# Patient Record
Sex: Female | Born: 1980
Health system: Southern US, Community
[De-identification: ages and names within clinical notes are randomized; demographics above are authoritative.]

## PROBLEM LIST (undated history)

## (undated) HISTORY — PX: EYE SURGERY: SHX253

---

## 2003-09-20 ENCOUNTER — Emergency Department (HOSPITAL_COMMUNITY): Admission: EM | Admit: 2003-09-20 | Discharge: 2003-09-20 | Payer: Self-pay | Admitting: Family Medicine

## 2003-12-29 ENCOUNTER — Emergency Department (HOSPITAL_COMMUNITY): Admission: EM | Admit: 2003-12-29 | Discharge: 2003-12-29 | Payer: Self-pay | Admitting: Family Medicine

## 2006-10-25 ENCOUNTER — Emergency Department (HOSPITAL_COMMUNITY): Admission: EM | Admit: 2006-10-25 | Discharge: 2006-10-25 | Payer: Self-pay | Admitting: Family Medicine

## 2013-10-12 ENCOUNTER — Other Ambulatory Visit (HOSPITAL_COMMUNITY)
Admission: RE | Admit: 2013-10-12 | Discharge: 2013-10-12 | Disposition: A | Discharge status: Home / Self Care | Payer: BC Managed Care – PPO | Source: Ambulatory Visit | Attending: Family Medicine | Admitting: Family Medicine

## 2013-10-12 ENCOUNTER — Other Ambulatory Visit: Payer: Self-pay | Admitting: Family Medicine

## 2013-10-12 DIAGNOSIS — Z1151 Encounter for screening for human papillomavirus (HPV): Secondary | ICD-10-CM | POA: Insufficient documentation

## 2013-10-12 DIAGNOSIS — Z113 Encounter for screening for infections with a predominantly sexual mode of transmission: Secondary | ICD-10-CM | POA: Insufficient documentation

## 2013-10-12 DIAGNOSIS — Z124 Encounter for screening for malignant neoplasm of cervix: Secondary | ICD-10-CM | POA: Insufficient documentation

## 2017-07-30 DIAGNOSIS — R922 Inconclusive mammogram: Secondary | ICD-10-CM | POA: Diagnosis not present

## 2017-07-31 ENCOUNTER — Other Ambulatory Visit (HOSPITAL_COMMUNITY)
Admission: RE | Admit: 2017-07-31 | Discharge: 2017-07-31 | Disposition: A | Discharge status: Home / Self Care | Payer: BLUE CROSS/BLUE SHIELD | Source: Ambulatory Visit | Attending: Family Medicine | Admitting: Family Medicine

## 2017-07-31 ENCOUNTER — Other Ambulatory Visit: Payer: Self-pay | Admitting: Family Medicine

## 2017-07-31 DIAGNOSIS — Z124 Encounter for screening for malignant neoplasm of cervix: Secondary | ICD-10-CM | POA: Diagnosis not present

## 2017-07-31 DIAGNOSIS — Z202 Contact with and (suspected) exposure to infections with a predominantly sexual mode of transmission: Secondary | ICD-10-CM | POA: Diagnosis not present

## 2017-07-31 DIAGNOSIS — Z0000 Encounter for general adult medical examination without abnormal findings: Secondary | ICD-10-CM | POA: Diagnosis not present

## 2017-07-31 DIAGNOSIS — E559 Vitamin D deficiency, unspecified: Secondary | ICD-10-CM | POA: Diagnosis not present

## 2017-08-04 ENCOUNTER — Other Ambulatory Visit: Payer: Self-pay | Admitting: Family Medicine

## 2017-08-04 DIAGNOSIS — R922 Inconclusive mammogram: Secondary | ICD-10-CM

## 2017-08-04 LAB — CYTOLOGY - PAP
Adequacy: ABSENT
CHLAMYDIA, DNA PROBE: NEGATIVE
DIAGNOSIS: NEGATIVE
HPV (WINDOPATH): NOT DETECTED
NEISSERIA GONORRHEA: NEGATIVE

## 2017-08-24 ENCOUNTER — Other Ambulatory Visit: Payer: Self-pay | Admitting: Family Medicine

## 2017-08-24 ENCOUNTER — Ambulatory Visit
Admission: RE | Admit: 2017-08-24 | Discharge: 2017-08-24 | Disposition: A | Discharge status: Home / Self Care | Payer: BLUE CROSS/BLUE SHIELD | Source: Ambulatory Visit | Attending: Family Medicine | Admitting: Family Medicine

## 2017-08-24 DIAGNOSIS — N6001 Solitary cyst of right breast: Secondary | ICD-10-CM | POA: Diagnosis not present

## 2017-08-24 DIAGNOSIS — R922 Inconclusive mammogram: Secondary | ICD-10-CM

## 2017-08-24 DIAGNOSIS — N63 Unspecified lump in unspecified breast: Secondary | ICD-10-CM

## 2018-03-02 ENCOUNTER — Other Ambulatory Visit: Payer: BLUE CROSS/BLUE SHIELD

## 2018-03-02 ENCOUNTER — Inpatient Hospital Stay: Admission: RE | Admit: 2018-03-02 | Payer: BLUE CROSS/BLUE SHIELD | Source: Ambulatory Visit

## 2018-03-02 ENCOUNTER — Inpatient Hospital Stay
Admission: RE | Admit: 2018-03-02 | Discharge: 2018-03-02 | Disposition: A | Discharge status: Home / Self Care | Payer: BLUE CROSS/BLUE SHIELD | Source: Ambulatory Visit | Attending: Family Medicine | Admitting: Family Medicine

## 2018-03-05 ENCOUNTER — Ambulatory Visit
Admission: RE | Admit: 2018-03-05 | Discharge: 2018-03-05 | Disposition: A | Discharge status: Home / Self Care | Payer: BLUE CROSS/BLUE SHIELD | Source: Ambulatory Visit | Attending: Family Medicine | Admitting: Family Medicine

## 2018-03-05 ENCOUNTER — Other Ambulatory Visit: Payer: Self-pay | Admitting: Family Medicine

## 2018-03-05 DIAGNOSIS — N63 Unspecified lump in unspecified breast: Secondary | ICD-10-CM

## 2018-03-05 DIAGNOSIS — R922 Inconclusive mammogram: Secondary | ICD-10-CM | POA: Diagnosis not present

## 2018-03-05 DIAGNOSIS — N6310 Unspecified lump in the right breast, unspecified quadrant: Secondary | ICD-10-CM | POA: Diagnosis not present

## 2018-05-19 DIAGNOSIS — F4322 Adjustment disorder with anxiety: Secondary | ICD-10-CM | POA: Diagnosis not present

## 2018-05-26 DIAGNOSIS — F4322 Adjustment disorder with anxiety: Secondary | ICD-10-CM | POA: Diagnosis not present

## 2018-06-16 DIAGNOSIS — F4320 Adjustment disorder, unspecified: Secondary | ICD-10-CM | POA: Diagnosis not present

## 2018-06-23 DIAGNOSIS — F4322 Adjustment disorder with anxiety: Secondary | ICD-10-CM | POA: Diagnosis not present

## 2018-06-30 DIAGNOSIS — F4320 Adjustment disorder, unspecified: Secondary | ICD-10-CM | POA: Diagnosis not present

## 2019-05-03 DIAGNOSIS — Z0000 Encounter for general adult medical examination without abnormal findings: Secondary | ICD-10-CM | POA: Diagnosis not present

## 2019-05-11 DIAGNOSIS — E559 Vitamin D deficiency, unspecified: Secondary | ICD-10-CM | POA: Diagnosis not present

## 2019-05-11 DIAGNOSIS — Z13220 Encounter for screening for lipoid disorders: Secondary | ICD-10-CM | POA: Diagnosis not present

## 2019-05-11 DIAGNOSIS — Z0000 Encounter for general adult medical examination without abnormal findings: Secondary | ICD-10-CM | POA: Diagnosis not present

## 2021-07-24 ENCOUNTER — Other Ambulatory Visit: Payer: Self-pay | Admitting: Internal Medicine

## 2021-07-24 DIAGNOSIS — Z1231 Encounter for screening mammogram for malignant neoplasm of breast: Secondary | ICD-10-CM

## 2021-08-13 ENCOUNTER — Ambulatory Visit: Payer: BLUE CROSS/BLUE SHIELD

## 2021-08-13 ENCOUNTER — Ambulatory Visit
Admission: RE | Admit: 2021-08-13 | Discharge: 2021-08-13 | Disposition: A | Discharge status: Home / Self Care | Payer: 59 | Source: Ambulatory Visit | Attending: Internal Medicine | Admitting: Internal Medicine

## 2021-08-13 DIAGNOSIS — Z1231 Encounter for screening mammogram for malignant neoplasm of breast: Secondary | ICD-10-CM

## 2021-08-28 ENCOUNTER — Ambulatory Visit: Payer: BLUE CROSS/BLUE SHIELD | Admitting: Registered"

## 2021-08-29 ENCOUNTER — Encounter: Payer: 59 | Attending: Internal Medicine | Admitting: Skilled Nursing Facility1

## 2021-08-29 ENCOUNTER — Encounter: Payer: Self-pay | Admitting: Skilled Nursing Facility1

## 2021-08-29 ENCOUNTER — Other Ambulatory Visit: Payer: Self-pay

## 2021-08-29 DIAGNOSIS — Z6839 Body mass index (BMI) 39.0-39.9, adult: Secondary | ICD-10-CM | POA: Insufficient documentation

## 2021-08-29 DIAGNOSIS — E6609 Other obesity due to excess calories: Secondary | ICD-10-CM | POA: Insufficient documentation

## 2021-08-29 DIAGNOSIS — Z713 Dietary counseling and surveillance: Secondary | ICD-10-CM | POA: Insufficient documentation

## 2021-08-29 DIAGNOSIS — E669 Obesity, unspecified: Secondary | ICD-10-CM

## 2021-08-29 NOTE — Progress Notes (Signed)
Medical Nutrition Therapy  Appointment Start time:  7:27  Appointment End time:  8:37  Primary concerns today: to lose weight  Referral diagnosis: Obesity Preferred learning style: auditory, visual Learning readiness: not ready, contemplating   NUTRITION ASSESSMENT    Clinical Medical Hx: N/A Medications: zyrtec, flonase Labs: vitamin D 39.9, RBC 4.06 Notable Signs/Symptoms: some joint pain, not as regular with her bowel movements maybe every 2 days, some burping  Lifestyle & Dietary Hx  Pt sates she wants to be healthier but loves food. Pt states her 41 year old is struggling with excess weight.  Pt states has has reduced her pork consumption. Pt states states when she menstruates she craves more carbs and chocolate.  Pt states she does carry stress and tension in her neck and shoulders.  Pt states she does talk therapy 2 times a month.  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 Pt states she has been activly working on not eating out.   Pt states her focus is to be well not necessarily weight.    Body Composition Scale 08/29/2021  Current Body Weight 221.6  Total Body Fat % 43.2  Visceral Fat 14  Fat-Free Mass % 56.7   Total Body Water % 42.8  Muscle-Mass lbs 30.4  BMI 40.4  Body Fat Displacement          Torso  lbs 59.3         Left Leg  lbs 11.8         Right Leg  lbs 11.8         Left Arm  lbs 5.9         Right Arm   lbs 5.9     Estimated daily fluid intake: 50 oz Supplements: multivitamin, vitamin D, vitamin C, vitamin b12 Sleep: some neck pain Stress / self-care: moderate with worry  Current average weekly physical activity: phone app to start this week aiming for 3 days a week  24-Hr Dietary Recall First Meal: starbucks green juice or egg and avacado toast + egg Snack:  Second Meal:  Snack: skinny pop Third Meal: penne pasta + alfredo + chicken + mushrooms + peppers + spinach + tomatoes Snack:  Beverages: 16  ounces coffee + splenda + flavored creamer, water + flavoring, sometimes gatorade zero  Estimated Energy Needs Calories: 1600  NUTRITION INTERVENTION  Nutrition education (E-1) on the following topics:  The importance of consistency with a healthy diet Creating a balanced meal Macronutrient distribution   Handouts Provided Include  Meal ideas   Learning Style & Readiness for Change Teaching method utilized: Visual & Auditory  Demonstrated degree of understanding via: Teach Back  Barriers to learning/adherence to lifestyle change: none identified   Goals Established by Pt Eat 3 meals a day 7 days a week Aim for 1 50 ounce bottle plus another 16 ounces of water Create balanced meals every 3-5 hours Workout with your fiton app 3 days a week: try stretching/yoga to release muscle tension    MONITORING & EVALUATION Dietary intake, weekly physical activity  Next Steps  Patient is to follow up in 4 weeks.

## 2021-09-19 ENCOUNTER — Ambulatory Visit: Payer: 59 | Admitting: Skilled Nursing Facility1

## 2021-09-26 ENCOUNTER — Other Ambulatory Visit: Payer: Self-pay

## 2021-09-26 ENCOUNTER — Encounter: Payer: 59 | Attending: Internal Medicine | Admitting: Skilled Nursing Facility1

## 2021-09-26 DIAGNOSIS — E669 Obesity, unspecified: Secondary | ICD-10-CM | POA: Insufficient documentation

## 2021-09-26 NOTE — Progress Notes (Signed)
Medical Nutrition Therapy  ? ?Primary concerns today: to lose weight  ?Referral diagnosis: Obesity ?Preferred learning style: auditory, visual ?Learning readiness: not ready, contemplating ? ? ?NUTRITION ASSESSMENT  ? ? ?Clinical ?Medical Hx: N/A ?Medications: zyrtec, flonase ?Labs: vitamin D 39.9, RBC 4.06 ?Notable Signs/Symptoms: some joint pain, not as regular with her bowel movements maybe every 2 days, some burping ? ?Lifestyle & Dietary Hx ? ?(Previous) Pt sates she wants to be healthier but loves food. Pt states her 42 year old is struggling with excess weight.  ?Pt states has has reduced her pork consumption. Pt states states when she menstruates she craves more carbs and chocolate.  ?Pt states she does carry stress and tension in her neck and shoulders.  ?Pt states she does talk therapy 2 times a month.  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 Pt states she has been activly working on not eating out.  ? ?Pt states her focus is to be well not necessarily weight.  ? ?Pt states he is very excited to be making changes and taking care of herself ?Pt states she has been very conscious of not skipping meals and drinking more water aiming for 60 ounces and tried steel cut oats which is taking some getting used too because it is chewier.  ?Pt states she is trying to encourage her son to make healthier changes as well recognizing she cannot force it but has been trying with him.  ?Pt states she is thinking through her food choices even when driving with [employer].  ?Pt states she also cut back on flavoring in her water as well.  ? ? ?Body Composition Scale 08/29/2021 09/26/2021  ?Current Body Weight 221.6 218.8  ?Total Body Fat % 43.2 42.9  ?Visceral Fat 14 13  ?Fat-Free Mass % 56.7 57  ? Total Body Water % 42.8 43  ?Muscle-Mass lbs 30.4 30.4  ?BMI 40.4 39.8  ?Body Fat Displacement    ?       Torso  lbs 59.3 58.1  ?       Left Leg  lbs 11.8 11.6  ?       Right Leg  lbs 11.8 11.6   ?       Left Arm  lbs 5.9 5.8  ?       Right Arm   lbs 5.9 5.8  ? ? ? ?Estimated daily fluid intake: 50 oz ?Supplements: multivitamin, vitamin D, vitamin C, vitamin b12 ?Sleep: some neck pain ?Stress / self-care: moderate with worry  ?Current average weekly physical activity: phone app to start this week aiming for 3 days a week ? ?24-Hr Dietary Recall ?First Meal: starbucks green juice or egg and avacado toast + egg or 2 egg and 2 toast whole wheat ?Snack:  ?Second Meal: hibachi ?Snack: [place] peanut butter biscuit  ?Third Meal: lemon pepper chicken + salad ?Snack:  ?Beverages: 16 ounces coffee + splenda + flavored creamer, water + flavoring, sometimes gatorade zero, plain water ? ?Estimated Energy Needs ?Calories: 1600 ? ?NUTRITION INTERVENTION  ?Nutrition education (E-1) on the following topics:  ?The importance of consistency with a healthy diet ?Creating a balanced meal ?Macronutrient distribution  ? ?Handouts Previously Provided Include  ?Meal ideas  ? ?Learning Style & Readiness for Change ?Teaching method utilized: Visual & Auditory  ?Demonstrated degree of understanding via: Teach Back  ?Barriers to learning/adherence to lifestyle change: none identified  ? ?Goals Established by Pt ?Continue:  Eat 3 meals a day 7 days a week ?Continue: Aim for 1 50 ounce bottle plus another 16 ounces of water ?Continue: Create balanced meals every 3-5 hours ?Continue/NEW: Workout with your fiton app 3 days a week: try stretching/yoga to release muscle tension  ? ? ?MONITORING & EVALUATION ?Dietary intake, weekly physical activity ? ?Next Steps  ?Patient is to follow up in 4 weeks. ?

## 2021-10-30 ENCOUNTER — Encounter: Payer: 59 | Attending: Internal Medicine | Admitting: Skilled Nursing Facility1

## 2021-10-30 DIAGNOSIS — Z6839 Body mass index (BMI) 39.0-39.9, adult: Secondary | ICD-10-CM | POA: Insufficient documentation

## 2021-10-30 DIAGNOSIS — E669 Obesity, unspecified: Secondary | ICD-10-CM

## 2021-10-30 NOTE — Progress Notes (Signed)
Medical Nutrition Therapy  ? ?Primary concerns today: to lose weight  ?Referral diagnosis: Obesity ?Preferred learning style: auditory, visual ?Learning readiness: not ready, contemplating ? ? ?NUTRITION ASSESSMENT  ? ? ?Clinical ?Medical Hx: N/A ?Medications: zyrtec, flonase ?Labs: vitamin D 39.9, RBC 4.06 ?Notable Signs/Symptoms: some joint pain, not as regular with her bowel movements maybe every 2 days, some burping ? ?Lifestyle & Dietary Hx ? ? ?Pt states her focus is to be well not necessarily weight.  ? ?Pt states this past month has been really rough stating it has messed with her food focus. Pt states she had a small breakthrough yesterday and took a moment. Pt states she did not include more exercise like she wanted stating she has been focusing on self care instead of worrying about everybody else.  ?Pt states she is proud of herself to keeping her goals in mind and trying to do right. ?Pts stated goals she has reached: Not finishing her whole plate, including vegetables, not just overeating becase of the enviromont.  ? ? ?Body Composition Scale 08/29/2021 09/26/2021 10/30/2021  ?Current Body Weight 221.6 218.8 218.5  ?Total Body Fat % 43.2 42.9 42.9  ?Visceral Fat 14 13 13   ?Fat-Free Mass % 56.7 57 57  ? Total Body Water % 42.8 43 43  ?Muscle-Mass lbs 30.4 30.4 30.4  ?BMI 40.4 39.8 39.8  ?Body Fat Displacement     ?       Torso  lbs 59.3 58.1 58  ?       Left Leg  lbs 11.8 11.6 11.6  ?       Right Leg  lbs 11.8 11.6 11.6  ?       Left Arm  lbs 5.9 5.8 5.8  ?       Right Arm   lbs 5.9 5.8 5.8  ? ? ? ?Estimated daily fluid intake: 50 oz ?Supplements: multivitamin, vitamin D, vitamin C, vitamin b12 ?Sleep: some neck pain ?Stress / self-care: moderate with worry  ?Current average weekly physical activity: ADL's ? ?24-Hr Dietary Recall ?First Meal: starbucks green juice or egg and avacado toast + egg or 2 egg and 2 toast whole wheat ?Snack:  ?Second Meal: hibachi ?Snack: [place] peanut butter biscuit   ?Third Meal: lemon pepper chicken + salad or pasta and zucchiniand salmon ?Snack:  ?Beverages: 16 ounces coffee + splenda + flavored creamer, water + flavoring, sometimes gatorade zero, plain water ? ?Estimated Energy Needs ?Calories: 1600 ? ?NUTRITION INTERVENTION  ?Nutrition education (E-1) on the following topics:  ?The importance of consistency with a healthy diet ?Creating a balanced meal ?Macronutrient distribution  ?Letting diet mentality go ? ?Handouts Previously Provided Include  ?Meal ideas  ? ?Learning Style & Readiness for Change ?Teaching method utilized: Visual & Auditory  ?Demonstrated degree of understanding via: Teach Back  ?Barriers to learning/adherence to lifestyle change: none identified  ? ?Goals Established by Pt ?Continue: Eat 3 meals a day 7 days a week ?Continue: Aim for 1 50 ounce bottle plus another 16 ounces of water ?Continue: Create balanced meals every 3-5 hours ?Continue/NEW: Workout with your fiton app 3 days a week: try stretching/yoga to release muscle tension  ? ? ?MONITORING & EVALUATION ?Dietary intake, weekly physical activity ? ?Next Steps  ?Patient is to follow up: pt states she needs to check with her insurance company to ensure these visits are covered  ?

## 2021-12-04 ENCOUNTER — Ambulatory Visit: Payer: 59 | Admitting: Skilled Nursing Facility1

## 2022-07-05 ENCOUNTER — Ambulatory Visit
Admission: EM | Admit: 2022-07-05 | Discharge: 2022-07-05 | Disposition: A | Discharge status: Home / Self Care | Payer: 59 | Attending: Physician Assistant | Admitting: Physician Assistant

## 2022-07-05 DIAGNOSIS — S46812A Strain of other muscles, fascia and tendons at shoulder and upper arm level, left arm, initial encounter: Secondary | ICD-10-CM | POA: Diagnosis not present

## 2022-07-05 DIAGNOSIS — M25512 Pain in left shoulder: Secondary | ICD-10-CM | POA: Diagnosis not present

## 2022-07-05 MED ORDER — PREDNISONE 20 MG PO TABS: 40.0000 mg | ORAL_TABLET | Freq: Every day | ORAL | 0 refills | Status: AC

## 2022-07-05 MED ORDER — CYCLOBENZAPRINE HCL 10 MG PO TABS: 10.0000 mg | ORAL_TABLET | Freq: Two times a day (BID) | ORAL | 0 refills | Status: AC | PRN

## 2022-07-05 NOTE — ED Provider Notes (Signed)
[facility]    CSN: [id number] Arrival date & time: 07/05/22  1251      History   Chief Complaint Chief Complaint  Patient presents with   Shoulder Pain    HPI Kristin Chase is a 41 y.o. female.   Patient here today for evaluation of left shoulder pain that radiates into her left arm at times.  She states that symptoms started after she recently had slept on her couch a few nights.  She is not sure if she slept incorrectly and that is what caused the pain.  She denies any other known injury.  She does note that throughout life she has noticed more pain in her left shoulder than her right and certain workouts seem to cause issues with her left.  She reports some cold sensation and a shooting type pain at times when she palpates her anterior shoulder.  She also notes a stiffness that is radiating from her neck into her posterior left shoulder.  Movement seems to worsen pain.  She has taken over-the-counter medication without resolution.  The history is provided by the patient.  Shoulder Pain Associated symptoms: no fever     History reviewed. No pertinent past medical history.  There are no problems to display for this patient.   Past Surgical History:  Procedure Laterality Date   EYE SURGERY      OB History   No obstetric history on file.      Home Medications    Prior to Admission medications   Medication Sig Start Date End Date Taking? Authorizing Provider  cyclobenzaprine (FLEXERIL) 10 MG tablet Take 1 tablet (10 mg total) by mouth 2 (two) times daily as needed for muscle spasms. 07/05/22  Yes Tomi Bamberger, PA-C  predniSONE (DELTASONE) 20 MG tablet Take 2 tablets (40 mg total) by mouth daily with breakfast for 5 days. 07/05/22 07/10/22 Yes Tomi Bamberger, PA-C    Family History Family History  Family history unknown: Yes    Social History Social History   Tobacco Use   Smoking status: Unknown     Allergies   Patient has no known  allergies.   Review of Systems Review of Systems  Constitutional:  Negative for chills and fever.  Eyes:  Negative for discharge and redness.  Gastrointestinal:  Negative for nausea and vomiting.  Musculoskeletal:  Positive for arthralgias and myalgias.  Neurological:  Negative for numbness.     Physical Exam Triage Vital Signs ED Triage Vitals  Enc Vitals Group     BP 07/05/22 1439 137/89     Pulse Rate 07/05/22 1439 86     Resp 07/05/22 1439 18     Temp 07/05/22 1439 98 F (36.7 C)     Temp Source 07/05/22 1439 Oral     SpO2 07/05/22 1439 100 %     Weight --      Height --      Head Circumference --      Peak Flow --      Pain Score 07/05/22 1438 6     Pain Loc --      Pain Edu? --      Excl. in GC? --    No data found.  Updated Vital Signs BP 137/89 (BP Location: Right Arm)   Pulse 86   Temp 98 F (36.7 C) (Oral)   Resp 18   LMP 06/07/2022   SpO2 100%      Physical Exam Vitals and nursing note  reviewed.  Constitutional:      General: She is not in acute distress.    Appearance: Normal appearance. She is not ill-appearing.  HENT:     Head: Normocephalic and atraumatic.  Eyes:     Conjunctiva/sclera: Conjunctivae normal.  Cardiovascular:     Rate and Rhythm: Normal rate.  Pulmonary:     Effort: Pulmonary effort is normal. No respiratory distress.  Musculoskeletal:     Comments: No TTP to thoracic or lumbar spine, Mild TTP to left trapezius, Normal ROM of left shoulder  Neurological:     Mental Status: She is alert.  Psychiatric:        Mood and Affect: Mood normal.        Behavior: Behavior normal.        Thought Content: Thought content normal.      UC Treatments / Results  Labs (all labs ordered are listed, but only abnormal results are displayed) Labs Reviewed - No data to display  EKG   Radiology No results found.  Procedures Procedures (including critical care time)  Medications Ordered in UC Medications - No data to  display  Initial Impression / Assessment and Plan / UC Course  I have reviewed the triage vital signs and the nursing notes.  Pertinent labs & imaging results that were available during my care of the patient were reviewed by me and considered in my medical decision making (see chart for details).    Will trial steroid burst and muscle relaxer for suspect muscle strain with possible inflammation affective nerve given shooting pain down arm. Recommend follow up if no gradual improvement or sooner with any worsening.   Final Clinical Impressions(s) / UC Diagnoses   Final diagnoses:  Acute pain of left shoulder  Trapezius muscle strain, left, initial encounter   Discharge Instructions   None    ED Prescriptions     Medication Sig Dispense Auth. Provider   predniSONE (DELTASONE) 20 MG tablet Take 2 tablets (40 mg total) by mouth daily with breakfast for 5 days. 10 tablet Erma Pinto F, PA-C   cyclobenzaprine (FLEXERIL) 10 MG tablet Take 1 tablet (10 mg total) by mouth 2 (two) times daily as needed for muscle spasms. 20 tablet Tomi Bamberger, PA-C      PDMP not reviewed this encounter.   Tomi Bamberger, PA-C 07/05/22 1519

## 2022-07-05 NOTE — ED Triage Notes (Signed)
Pt left shoulder pain X 1 week that she believes may be associated with sleeping pattern .

## 2022-08-18 DIAGNOSIS — Z0000 Encounter for general adult medical examination without abnormal findings: Secondary | ICD-10-CM | POA: Diagnosis not present

## 2022-08-18 DIAGNOSIS — Z124 Encounter for screening for malignant neoplasm of cervix: Secondary | ICD-10-CM | POA: Diagnosis not present

## 2022-08-18 DIAGNOSIS — Z131 Encounter for screening for diabetes mellitus: Secondary | ICD-10-CM | POA: Diagnosis not present

## 2022-08-18 DIAGNOSIS — E559 Vitamin D deficiency, unspecified: Secondary | ICD-10-CM | POA: Diagnosis not present

## 2022-12-03 DIAGNOSIS — R079 Chest pain, unspecified: Secondary | ICD-10-CM | POA: Diagnosis not present

## 2022-12-03 DIAGNOSIS — S99921A Unspecified injury of right foot, initial encounter: Secondary | ICD-10-CM | POA: Diagnosis not present

## 2023-02-04 DIAGNOSIS — Z03818 Encounter for observation for suspected exposure to other biological agents ruled out: Secondary | ICD-10-CM | POA: Diagnosis not present

## 2023-02-04 DIAGNOSIS — J029 Acute pharyngitis, unspecified: Secondary | ICD-10-CM | POA: Diagnosis not present

## 2023-02-09 ENCOUNTER — Other Ambulatory Visit: Payer: Self-pay | Admitting: Internal Medicine

## 2023-02-09 DIAGNOSIS — N63 Unspecified lump in unspecified breast: Secondary | ICD-10-CM

## 2023-02-25 ENCOUNTER — Ambulatory Visit
Admission: RE | Admit: 2023-02-25 | Discharge: 2023-02-25 | Disposition: A | Discharge status: Home / Self Care | Payer: 59 | Source: Ambulatory Visit | Attending: Internal Medicine | Admitting: Internal Medicine

## 2023-02-25 ENCOUNTER — Other Ambulatory Visit: Payer: Self-pay | Admitting: Internal Medicine

## 2023-02-25 DIAGNOSIS — N6011 Diffuse cystic mastopathy of right breast: Secondary | ICD-10-CM | POA: Diagnosis not present

## 2023-02-25 DIAGNOSIS — N6311 Unspecified lump in the right breast, upper outer quadrant: Secondary | ICD-10-CM | POA: Diagnosis not present

## 2023-02-25 DIAGNOSIS — N63 Unspecified lump in unspecified breast: Secondary | ICD-10-CM

## 2023-08-19 DIAGNOSIS — L309 Dermatitis, unspecified: Secondary | ICD-10-CM | POA: Diagnosis not present

## 2023-08-19 DIAGNOSIS — Z13220 Encounter for screening for lipoid disorders: Secondary | ICD-10-CM | POA: Diagnosis not present

## 2023-08-19 DIAGNOSIS — L304 Erythema intertrigo: Secondary | ICD-10-CM | POA: Diagnosis not present

## 2023-08-19 DIAGNOSIS — Z0000 Encounter for general adult medical examination without abnormal findings: Secondary | ICD-10-CM | POA: Diagnosis not present

## 2023-08-19 DIAGNOSIS — E559 Vitamin D deficiency, unspecified: Secondary | ICD-10-CM | POA: Diagnosis not present

## 2023-08-19 DIAGNOSIS — E6609 Other obesity due to excess calories: Secondary | ICD-10-CM | POA: Diagnosis not present

## 2023-08-19 DIAGNOSIS — Z23 Encounter for immunization: Secondary | ICD-10-CM | POA: Diagnosis not present

## 2023-08-19 DIAGNOSIS — Z6839 Body mass index (BMI) 39.0-39.9, adult: Secondary | ICD-10-CM | POA: Diagnosis not present

## 2023-08-19 DIAGNOSIS — M79674 Pain in right toe(s): Secondary | ICD-10-CM | POA: Diagnosis not present

## 2023-09-06 IMAGING — MG MM DIGITAL SCREENING BILAT W/ TOMO AND CAD
8 series · 8 of 24 positions shown · non-contrast
Comparison: Previous exam(s).

CLINICAL DATA: Screening.

EXAM:
DIGITAL SCREENING BILATERAL MAMMOGRAM WITH TOMOSYNTHESIS AND CAD
TECHNIQUE: Bilateral screening digital craniocaudal and mediolateral oblique
mammograms were obtained. Bilateral screening digital breast
tomosynthesis was performed. The images were evaluated with
computer-aided detection.

[R CC synth-2D]
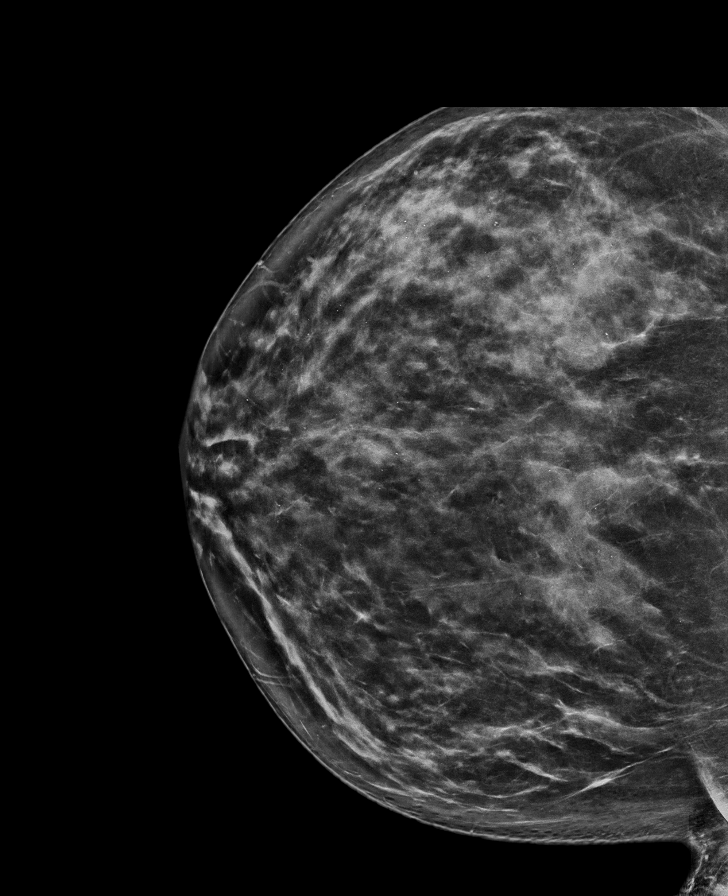

[L CC synth-2D]
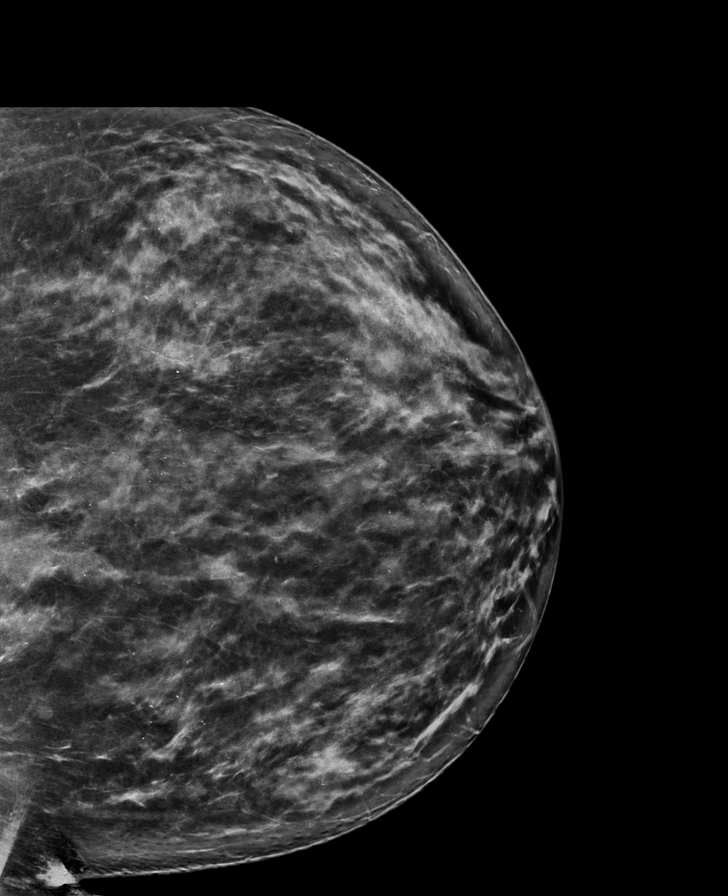

[L MLO synth-2D]
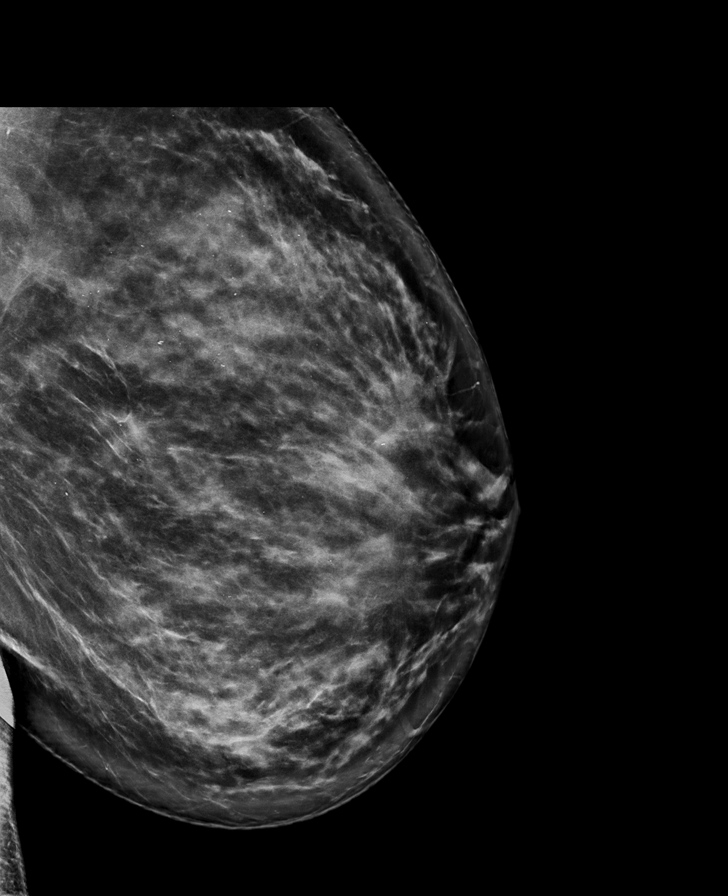

[R MLO synth-2D]
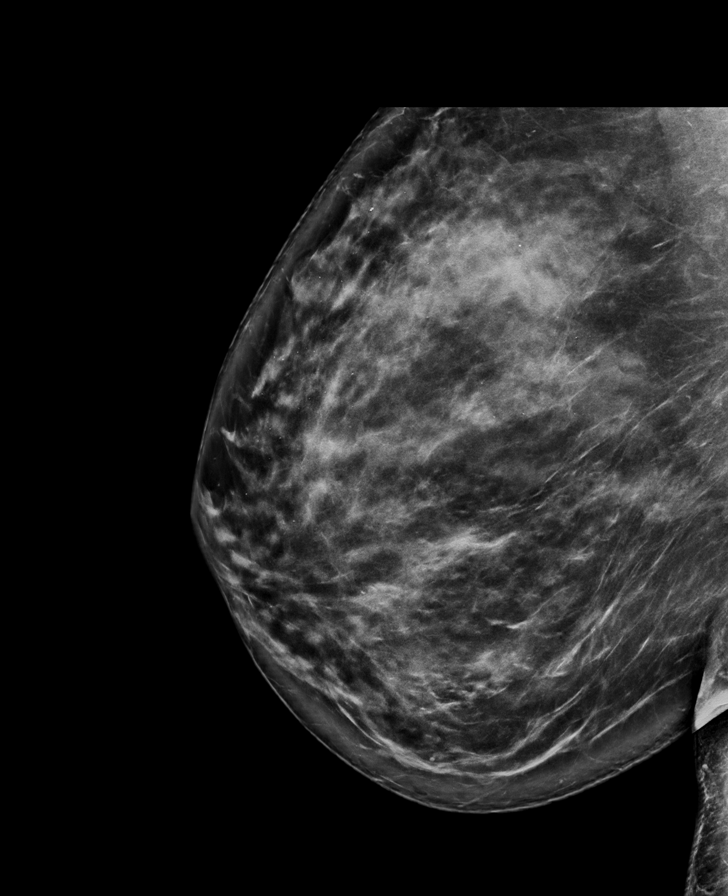

[R CC tomo · tomo slice 47/92.0]
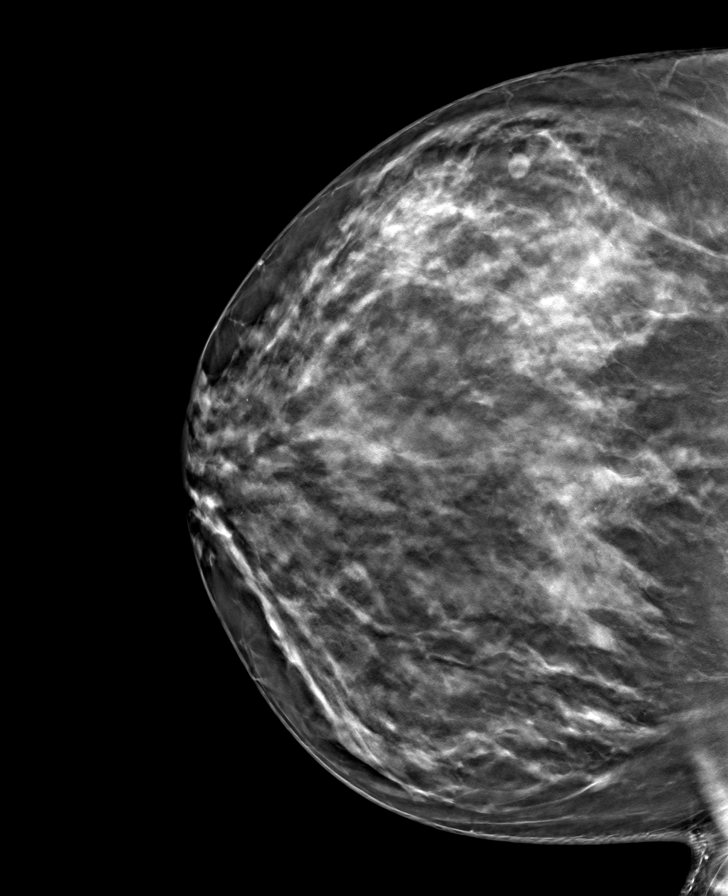

[L CC tomo · tomo slice 47/92.0]
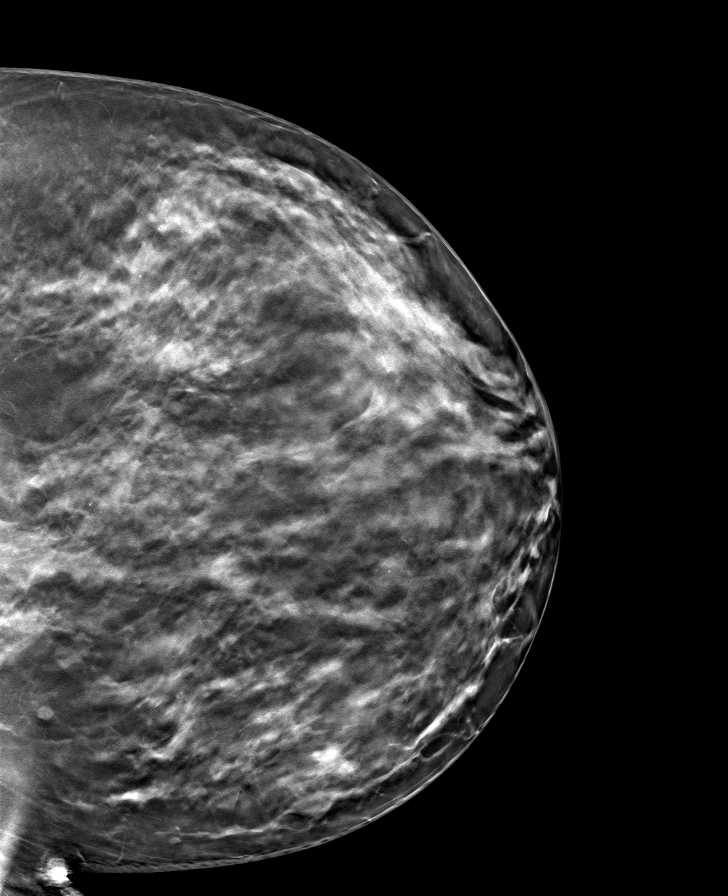

[R MLO tomo · tomo slice 53/105.0]
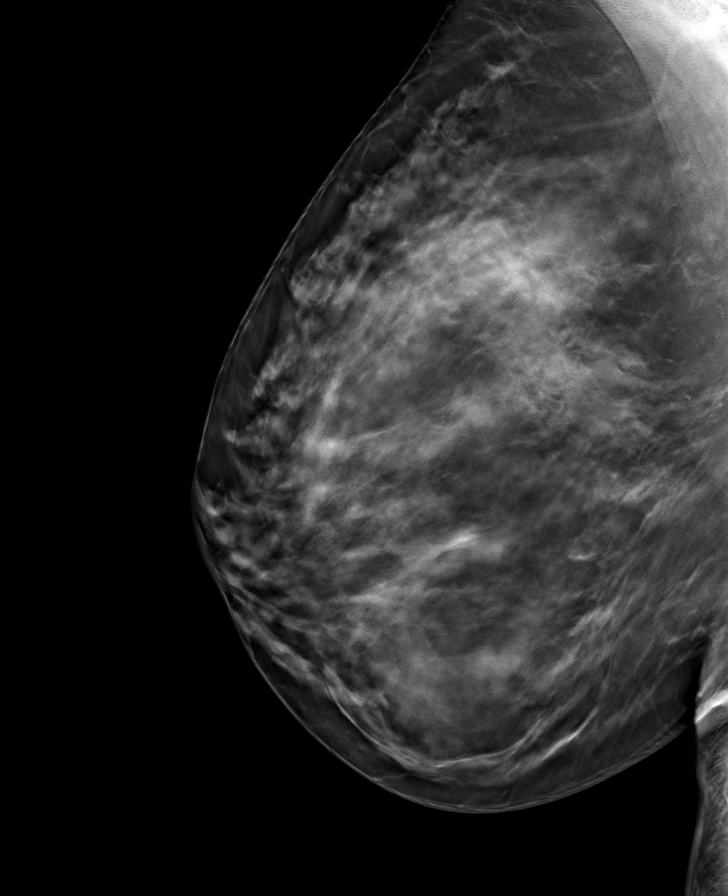

[L MLO tomo · tomo slice 52/103.0]
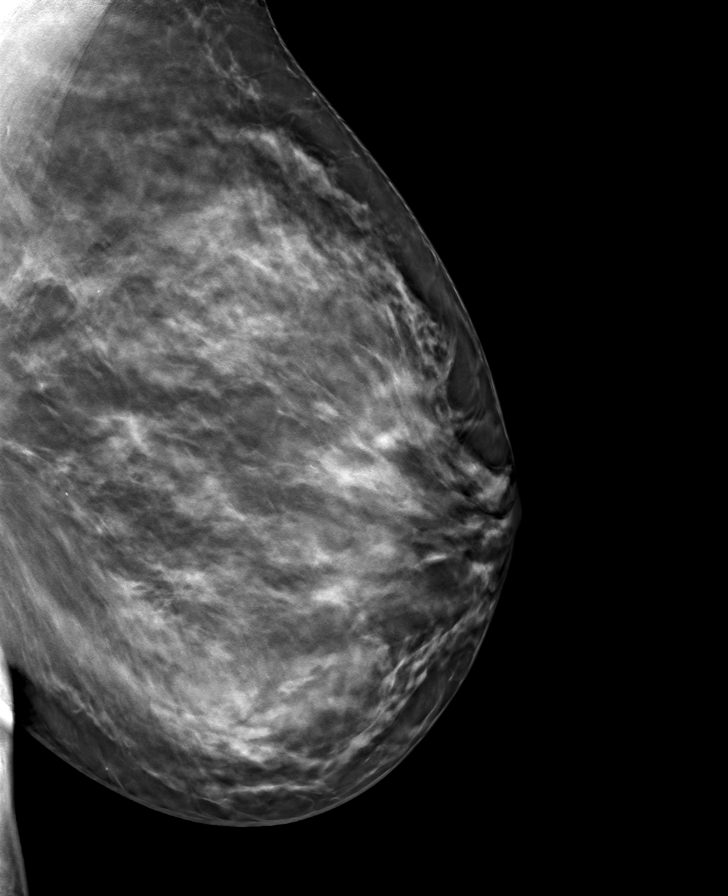

[8 of 24 positions shown; findings below may reference images not displayed]

ACR Breast Density Category c: The breast tissue is heterogeneously
dense, which may obscure small masses.
FINDINGS: There are no findings suspicious for malignancy.
IMPRESSION: No mammographic evidence of malignancy. A result letter of this
screening mammogram will be mailed directly to the patient.

RECOMMENDATION:
Screening mammogram in one year. (Code:Q3-W-BC3)

BI-RADS CATEGORY  1: Negative.

## 2023-09-07 ENCOUNTER — Ambulatory Visit (INDEPENDENT_AMBULATORY_CARE_PROVIDER_SITE_OTHER): Payer: 59

## 2023-09-07 ENCOUNTER — Encounter: Payer: Self-pay | Admitting: Podiatry

## 2023-09-07 ENCOUNTER — Ambulatory Visit: Payer: 59 | Admitting: Podiatry

## 2023-09-07 DIAGNOSIS — S99921D Unspecified injury of right foot, subsequent encounter: Secondary | ICD-10-CM | POA: Diagnosis not present

## 2023-09-07 DIAGNOSIS — M7751 Other enthesopathy of right foot: Secondary | ICD-10-CM | POA: Diagnosis not present

## 2023-09-07 MED ORDER — BETAMETHASONE SOD PHOS & ACET 6 (3-3) MG/ML IJ SUSP
3.0000 mg | Freq: Once | INTRAMUSCULAR | Status: AC
  Administered 2023-09-07: 3 mg via INTRA_ARTICULAR

## 2023-09-07 NOTE — Progress Notes (Addendum)
   Chief Complaint  Patient presents with   Toe Pain    Patient states some years ago she drop something on her right foot, patient hit her toe on some steps and it has been hurting every since, Patient has pain in her joint at the top of her right hallux. No medication for pain .    HPI: 43 y.o. female presenting today for evaluation of intermittent pain and tenderness associated to the right great toe joint.  She does have a history of injuring it about 1 year ago.  Since that time it has essentially resolved however she continues to have intermittent stiffness and pain when she wears dress shoes with heels.  Presenting for further treatment and evaluation  History reviewed. No pertinent past medical history.  Past Surgical History:  Procedure Laterality Date   EYE SURGERY      No Known Allergies   Physical Exam: General: The patient is alert and oriented x3 in no acute distress.  Dermatology: Skin is warm, dry and supple bilateral lower extremities.   Vascular: Palpable pedal pulses bilaterally. Capillary refill within normal limits.  No appreciable edema.  No erythema.  Neurological: Grossly intact via light touch  Musculoskeletal Exam: There is some tenderness to palpation at the dorsal lateral aspect of the first MTP of the right foot.  There is also some limited dorsiflexion of the MTP compared to the contralateral limb.  Radiographic Exam RT foot 09/07/2023:  Normal osseous mineralization. Joint spaces preserved.  On lateral view there is some arthritic change to the IPJ of the great toe however clinically this area is asymptomatic  Assessment/Plan of Care: 1.  First MTP capsulitis right great toe  -Patient evaluated.  X-rays reviewed -Injection of 0.5 cc Celestone  Soluspan injected in the first MTP of the right foot -Recommend good supportive tennis shoes and sneakers.  Patient states that she only wears heels on Sundays. -Declined any oral anti-inflammatory -If the  patient continues to have pain and tenderness associated to the great toe joint I do believe MRI would be warranted since there is a history of injury and there is been no improvement over the past year.  She will contact our office if she would like to pursue an MRI and we will have it ordered and she will follow-up in the office after -Return to clinic as needed       Dot Gazella, DPM [facility]  Dr. Dot Gazella, DPM    [address] [place], [place] [address]                Office [phone]  Fax [phone]

## 2023-11-18 DIAGNOSIS — R067 Sneezing: Secondary | ICD-10-CM | POA: Diagnosis not present

## 2023-11-18 DIAGNOSIS — J029 Acute pharyngitis, unspecified: Secondary | ICD-10-CM | POA: Diagnosis not present

## 2024-01-26 DIAGNOSIS — E66813 Obesity, class 3: Secondary | ICD-10-CM | POA: Diagnosis not present

## 2024-01-26 DIAGNOSIS — J301 Allergic rhinitis due to pollen: Secondary | ICD-10-CM | POA: Diagnosis not present

## 2024-01-26 DIAGNOSIS — Z6841 Body Mass Index (BMI) 40.0 and over, adult: Secondary | ICD-10-CM | POA: Diagnosis not present

## 2024-05-30 ENCOUNTER — Other Ambulatory Visit: Payer: Self-pay | Admitting: Internal Medicine

## 2024-05-30 DIAGNOSIS — Z1231 Encounter for screening mammogram for malignant neoplasm of breast: Secondary | ICD-10-CM

## 2024-06-08 ENCOUNTER — Ambulatory Visit
Admission: RE | Admit: 2024-06-08 | Discharge: 2024-06-08 | Disposition: A | Discharge status: Home / Self Care | Source: Ambulatory Visit | Attending: Internal Medicine | Admitting: Internal Medicine

## 2024-06-08 DIAGNOSIS — Z1231 Encounter for screening mammogram for malignant neoplasm of breast: Secondary | ICD-10-CM
